# Patient Record
Sex: Male | Born: 1985 | Race: White | Hispanic: No | Marital: Married | Smoking: Never smoker
Health system: Southern US, Community
[De-identification: ages and names within clinical notes are randomized; demographics above are authoritative.]

## PROBLEM LIST (undated history)

## (undated) HISTORY — PX: VASECTOMY: SHX75

---

## 2019-03-17 ENCOUNTER — Emergency Department (HOSPITAL_BASED_OUTPATIENT_CLINIC_OR_DEPARTMENT_OTHER)
Admission: EM | Admit: 2019-03-17 | Discharge: 2019-03-17 | Disposition: A | Discharge status: Home / Self Care | Payer: 59 | Attending: Emergency Medicine | Admitting: Emergency Medicine

## 2019-03-17 ENCOUNTER — Emergency Department (HOSPITAL_BASED_OUTPATIENT_CLINIC_OR_DEPARTMENT_OTHER): Payer: 59

## 2019-03-17 ENCOUNTER — Encounter (HOSPITAL_BASED_OUTPATIENT_CLINIC_OR_DEPARTMENT_OTHER): Payer: Self-pay | Admitting: *Deleted

## 2019-03-17 ENCOUNTER — Other Ambulatory Visit: Payer: Self-pay

## 2019-03-17 DIAGNOSIS — R51 Headache: Secondary | ICD-10-CM | POA: Diagnosis not present

## 2019-03-17 DIAGNOSIS — F0781 Postconcussional syndrome: Secondary | ICD-10-CM | POA: Insufficient documentation

## 2019-03-17 DIAGNOSIS — R42 Dizziness and giddiness: Secondary | ICD-10-CM | POA: Diagnosis not present

## 2019-03-17 MED ORDER — DROPERIDOL 2.5 MG/ML IJ SOLN
2.5000 mg | Freq: Once | INTRAMUSCULAR | Status: AC
  Administered 2019-03-17: 16:00:00 2.5 mg via INTRAMUSCULAR
  Filled 2019-03-17: qty 2

## 2019-03-17 MED ORDER — MECLIZINE HCL 25 MG PO TABS
25.0000 mg | ORAL_TABLET | Freq: Once | ORAL | Status: AC
  Administered 2019-03-17: 25 mg via ORAL
  Filled 2019-03-17: qty 1
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
## 2019-03-17 NOTE — ED Notes (Signed)
Patient transported to CT 
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
## 2020-07-14 ENCOUNTER — Emergency Department (HOSPITAL_COMMUNITY): Payer: BC Managed Care – PPO

## 2020-07-14 ENCOUNTER — Encounter (HOSPITAL_COMMUNITY): Payer: Self-pay

## 2020-07-14 ENCOUNTER — Other Ambulatory Visit: Payer: Self-pay

## 2020-07-14 DIAGNOSIS — U071 COVID-19: Secondary | ICD-10-CM | POA: Insufficient documentation

## 2020-07-14 DIAGNOSIS — R05 Cough: Secondary | ICD-10-CM | POA: Diagnosis present

## 2020-07-14 DIAGNOSIS — Z5321 Procedure and treatment not carried out due to patient leaving prior to being seen by health care provider: Secondary | ICD-10-CM | POA: Insufficient documentation

## 2020-07-14 LAB — COMPREHENSIVE METABOLIC PANEL
ALT: 39 U/L (ref 0–44)
AST: 78 U/L — ABNORMAL HIGH (ref 15–41)
Albumin: 4.4 g/dL (ref 3.5–5.0)
Alkaline Phosphatase: 59 U/L (ref 38–126)
Anion gap: 9 (ref 5–15)
BUN: 12 mg/dL (ref 6–20)
CO2: 26 mmol/L (ref 22–32)
Calcium: 8.7 mg/dL — ABNORMAL LOW (ref 8.9–10.3)
Chloride: 102 mmol/L (ref 98–111)
Creatinine, Ser: 0.81 mg/dL (ref 0.61–1.24)
GFR calc Af Amer: >60 mL/min (ref >60)
GFR calc non Af Amer: >60 mL/min (ref >60)
Glucose, Bld: 120 mg/dL — ABNORMAL HIGH (ref 70–99)
Potassium: 4.3 mmol/L (ref 3.5–5.1)
Sodium: 137 mmol/L (ref 135–145)
Total Bilirubin: 0.3 mg/dL (ref 0.3–1.2)
Total Protein: 7.9 g/dL (ref 6.5–8.1)

## 2020-07-14 LAB — CBC
HCT: 43.1 % (ref 39.0–52.0)
Hemoglobin: 13.1 g/dL (ref 13.0–17.0)
MCH: 22 pg — ABNORMAL LOW (ref 26.0–34.0)
MCHC: 30.4 g/dL (ref 30.0–36.0)
MCV: 72.3 fL — ABNORMAL LOW (ref 80.0–100.0)
Platelets: 358 10*3/uL (ref 150–400)
RBC: 5.96 MIL/uL — ABNORMAL HIGH (ref 4.22–5.81)
RDW: 14.4 % (ref 11.5–15.5)
WBC: 6 10*3/uL (ref 4.0–10.5)
nRBC: 0 % (ref 0.0–0.2)

## 2020-07-14 LAB — LIPASE, BLOOD: Lipase: 25 U/L (ref 11–51)

## 2020-07-14 LAB — SARS CORONAVIRUS 2 BY RT PCR (HOSPITAL ORDER, PERFORMED IN ~~LOC~~ HOSPITAL LAB): SARS Coronavirus 2: POSITIVE — AB

## 2020-07-14 NOTE — ED Notes (Signed)
Date and time results received: 07/14/20 2330 (use smartphrase ".now" to insert current time)  Test: Covid Critical Value: Posistive  Name of Provider Notified: Silverio Lay Md Orders Received? Or Actions Taken?: No new orders

## 2020-07-14 NOTE — ED Triage Notes (Signed)
Pt reports N/V/SOB and cough since Friday. Reports that his cousins who visited him last week just tested positive for COVID. Pt is not vaccinated.

## 2020-07-15 ENCOUNTER — Emergency Department (HOSPITAL_COMMUNITY)
Admission: EM | Admit: 2020-07-15 | Discharge: 2020-07-15 | Disposition: A | Discharge status: Home / Self Care | Payer: BC Managed Care – PPO | Attending: Emergency Medicine | Admitting: Emergency Medicine

## 2020-07-15 NOTE — ED Notes (Signed)
Pt returned his stickers to registration  
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
## 2021-11-28 ENCOUNTER — Ambulatory Visit
Admission: EM | Admit: 2021-11-28 | Discharge: 2021-11-28 | Disposition: A | Discharge status: Home / Self Care | Payer: 59 | Attending: Emergency Medicine | Admitting: Emergency Medicine

## 2021-11-28 ENCOUNTER — Other Ambulatory Visit: Payer: Self-pay

## 2021-11-28 DIAGNOSIS — B9689 Other specified bacterial agents as the cause of diseases classified elsewhere: Secondary | ICD-10-CM | POA: Insufficient documentation

## 2021-11-28 DIAGNOSIS — J038 Acute tonsillitis due to other specified organisms: Secondary | ICD-10-CM | POA: Insufficient documentation

## 2021-11-28 DIAGNOSIS — J029 Acute pharyngitis, unspecified: Secondary | ICD-10-CM | POA: Insufficient documentation

## 2021-11-28 DIAGNOSIS — J358 Other chronic diseases of tonsils and adenoids: Secondary | ICD-10-CM

## 2021-11-28 DIAGNOSIS — J351 Hypertrophy of tonsils: Secondary | ICD-10-CM | POA: Insufficient documentation

## 2021-11-28 LAB — POCT RAPID STREP A (OFFICE): Rapid Strep A Screen: NEGATIVE

## 2021-11-28 MED ORDER — CEFDINIR 300 MG PO CAPS: 300.0000 mg | ORAL_CAPSULE | Freq: Two times a day (BID) | ORAL | 0 refills | Status: AC

## 2021-11-28 NOTE — Discharge Instructions (Addendum)
Based on my physical exam today and the history that you provided, I believe that you have bacterial pharyngitis.  Bacterial pharyngitis is most commonly caused by bacteria called group A Streptococcus but there are many other culprits.    The rapid strep test that we performed in the office only catches 40% of known cases of group A streptococcal pharyngitis.  Additionally, and unfortunately, the throat culture that we perform here in the urgent care only evaluates for group A strep and not any of the other bacteria  that are known to cause bacterial pharyngitis.  I have prescribed you an antibiotic to treat you for bacterial pharyngitis which covers group A strep along with other known causative organisms.  Please take this antibiotic as prescribed and do not skip any doses.  Once you have been on antibiotics for a full 24 hours, you are no longer considered contagious.  If you receive a phone call telling you that your throat culture is negative, please keep in mind that it is only negative for group A strep and that the lab does not test for any of the other bacteria that cause bacterial pharyngitis.  For this reason, I strongly recommend that you complete your entire prescription of antibiotics regardless of the result of a throat culture, particularly if you begin to feel better within the first 48 hours of therapy.  Please follow-up with your primary care provider in the next week to ten days for repeat evaluation.  Please follow-up within the next three to five days either with your primary care provider or urgent care if your symptoms do not resolve.  If you do not have a primary care provider, we will assist you in finding one.

## 2021-11-28 NOTE — ED Provider Notes (Signed)
[facility]    CSN: [id number] Arrival date & time: 11/28/21  [id number]    HISTORY   Chief Complaint  Patient presents with   Sore Throat   HPI Jordan Kidd is a 36 y.o. male. Patient complains of sore throat and fever.  Patient states has not tried any over-the-counter medications for his symptoms.  Patient denies known sick contacts.  Patient has a slightly elevated temperature 99.6 today with a normal heart rate.  Oxygenation is normal, patient has elevated blood pressure.  Patient states he has been waking up soaking wet middle of the night, states this has been going on for 3 nights.  Patient states he has 2 small children, no one at home is been sick.  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  Patient denies nasal congestion, cough, body aches, otalgia, nausea, vomiting, diarrhea, headache.  The history is provided by the patient.  History reviewed. No pertinent past medical history. There are no problems to display for this patient.  History reviewed. No pertinent surgical history.  Home Medications    Prior to Admission medications   Not on File   Family History History reviewed. No pertinent family history. Social History Social History   Tobacco Use   Smoking status: Never   Smokeless tobacco: Never  Substance Use Topics   Alcohol use: Not Currently   Drug use: Never   Allergies   Sumatriptan  Review of Systems Review of Systems Pertinent findings noted in history of present illness.   Physical Exam Triage Vital Signs ED Triage Vitals  Enc Vitals Group     BP 09/23/21 0827 (!) 147/82     Pulse Rate 09/23/21 0827 72     Resp 09/23/21 0827 18     Temp 09/23/21 0827 98.3 F (36.8 C)     Temp Source 09/23/21 0827 Oral     SpO2 09/23/21 0827 98 %     Weight --      Height --      Head Circumference --      Peak Flow --      Pain Score 09/23/21 0826 5     Pain Loc --      Pain Edu? --      Excl. in GC? --   No  data found.  Updated Vital Signs BP (!) 147/82 (BP Location: Left Arm)    Pulse 89    Temp 99.6 F (37.6 C) (Oral)    Resp 16    SpO2 100%   Physical Exam Constitutional:      General: He is not in acute distress.    Appearance: He is well-developed. He is not ill-appearing or toxic-appearing.  HENT:     Head: Normocephalic and atraumatic.     Salivary Glands: Right salivary gland is diffusely enlarged and tender. Left salivary gland is diffusely enlarged and tender.     Right Ear: Hearing and external ear normal.     Left Ear: Hearing and external ear normal.     Ears:     Comments: Bilateral EACs with mild erythema, bilateral TMs are normal    Nose: No mucosal edema, congestion or rhinorrhea.     Right Turbinates: Not enlarged, swollen or pale.     Left Turbinates: Not enlarged or swollen.     Right Sinus: No maxillary sinus tenderness or frontal sinus tenderness.     Left Sinus: No maxillary sinus tenderness or frontal sinus tenderness.  Mouth/Throat:     Lips: Pink. No lesions.     Mouth: Mucous membranes are moist. No oral lesions or angioedema.     Dentition: No gingival swelling.     Tongue: No lesions.     Palate: No mass.     Pharynx: Uvula midline. Pharyngeal swelling, oropharyngeal exudate and posterior oropharyngeal erythema present. No uvula swelling.     Tonsils: Tonsillar exudate present. 2+ on the right. 2+ on the left.  Eyes:     Extraocular Movements: Extraocular movements intact.     Conjunctiva/sclera: Conjunctivae normal.     Pupils: Pupils are equal, round, and reactive to light.  Neck:     Thyroid: No thyroid mass, thyromegaly or thyroid tenderness.     Trachea: Tracheal tenderness present. No abnormal tracheal secretions or tracheal deviation.     Comments: Voice is muffled Cardiovascular:     Rate and Rhythm: Normal rate and regular rhythm.     Pulses: Normal pulses.     Heart sounds: Normal heart sounds, S1 normal and S2 normal. No murmur heard.    No friction rub. No gallop.  Pulmonary:     Effort: Pulmonary effort is normal. No accessory muscle usage, prolonged expiration, respiratory distress or retractions.     Breath sounds: No stridor, decreased air movement or transmitted upper airway sounds. No decreased breath sounds, wheezing, rhonchi or rales.  Abdominal:     General: Bowel sounds are normal.     Palpations: Abdomen is soft.     Tenderness: There is generalized abdominal tenderness. There is no right CVA tenderness, left CVA tenderness or rebound. Negative signs include Murphy's sign.     Hernia: No hernia is present.  Musculoskeletal:        General: No tenderness. Normal range of motion.     Cervical back: Full passive range of motion without pain, normal range of motion and neck supple.     Right lower leg: No edema.     Left lower leg: No edema.  Lymphadenopathy:     Cervical: Cervical adenopathy present.     Right cervical: Superficial cervical adenopathy present.     Left cervical: Superficial cervical adenopathy present.  Skin:    General: Skin is warm and dry.     Findings: No erythema, lesion or rash.  Neurological:     General: No focal deficit present.     Mental Status: He is alert and oriented to person, place, and time. Mental status is at baseline.  Psychiatric:        Mood and Affect: Mood normal.        Behavior: Behavior normal.        Thought Content: Thought content normal.        Judgment: Judgment normal.    Visual Acuity Right Eye Distance:   Left Eye Distance:   Bilateral Distance:    Right Eye Near:   Left Eye Near:    Bilateral Near:     UC Couse / Diagnostics / Procedures:    EKG  Radiology No results found.  Procedures Procedures (including critical care time)  UC Diagnoses / Final Clinical Impressions(s)   I have reviewed the triage vital signs and the nursing notes.  Pertinent labs & imaging results that were available during my care of the patient were reviewed by  me and considered in my medical decision making (see chart for details).   Final diagnoses:  Enlarged tonsils  Tonsillar exudate  Acute pharyngitis, unspecified etiology  Acute bacterial tonsillitis   Bacterial tonsillitis versus pharyngitis possibly both.  Rapid strep test today is negative.  We will begin cefdinir to treat empirically for presumed bacterial infection.  Return precautions advised.  Note provided for work.  ED Prescriptions     Medication Sig Dispense Auth. Provider   cefdinir (OMNICEF) 300 MG capsule Take 1 capsule (300 mg total) by mouth 2 (two) times daily for 10 days. 20 capsule Theadora RamaMorgan, Merriam Brandner Scales, PA-C      PDMP not reviewed this encounter.  Pending results:  Labs Reviewed  CULTURE, GROUP A STREP [facility])  POCT RAPID STREP A (OFFICE)    Medications Ordered in UC: Medications - No data to display  Disposition Upon Discharge:  Condition: stable for discharge home Home: take medications as prescribed; routine discharge instructions as discussed; follow up as advised.  Patient presented with an acute illness with associated systemic symptoms and significant discomfort requiring urgent management. In my opinion, this is a condition that a prudent lay person (someone who possesses an average knowledge of health and medicine) may potentially expect to result in complications if not addressed urgently such as respiratory distress, impairment of bodily function or dysfunction of bodily organs.   Routine symptom specific, illness specific and/or disease specific instructions were discussed with the patient and/or caregiver at length.   As such, the patient has been evaluated and assessed, work-up was performed and treatment was provided in alignment with urgent care protocols and evidence based medicine.  Patient/parent/caregiver has been advised that the patient may require follow up for further testing and treatment if the symptoms continue in spite of treatment, as  clinically indicated and appropriate.  If the patient was tested for COVID-19, Influenza and/or RSV, then the patient/parent/guardian was advised to isolate at home pending the results of his/her diagnostic coronavirus test and potentially longer if theyre positive. I have also advised pt that if his/her COVID-19 test returns positive, it's recommended to self-isolate for at least 10 days after symptoms first appeared AND until fever-free for 24 hours without fever reducer AND other symptoms have improved or resolved. Discussed self-isolation recommendations as well as instructions for household member/close contacts as per the [facility] and [place] [facility], and also gave patient the COVID packet with this information.  Patient/parent/caregiver has been advised to return to the [facility] or PCP in 3-5 days if no better; to PCP or the Emergency Department if new signs and symptoms develop, or if the current signs or symptoms continue to change or worsen for further workup, evaluation and treatment as clinically indicated and appropriate  The patient will follow up with their current PCP if and as advised. If the patient does not currently have a PCP we will assist them in obtaining one.   The patient may need specialty follow up if the symptoms continue, in spite of conservative treatment and management, for further workup, evaluation, consultation and treatment as clinically indicated and appropriate.  Patient/parent/caregiver verbalized understanding and agreement of plan as discussed.  All questions were addressed during visit.  Please see discharge instructions below for further details of plan.  Discharge Instructions:   Discharge Instructions      Based on my physical exam today and the history that you provided, I believe that you have bacterial pharyngitis.  Bacterial pharyngitis is most commonly caused by bacteria called group A Streptococcus but there are many other culprits.    The rapid strep test that  we performed in the office only catches 40% of  known cases of group A streptococcal pharyngitis.  Additionally, and unfortunately, the throat culture that we perform here in the urgent care only evaluates for group A strep and not any of the other bacteria  that are known to cause bacterial pharyngitis.  I have prescribed you an antibiotic to treat you for bacterial pharyngitis which covers group A strep along with other known causative organisms.  Please take this antibiotic as prescribed and do not skip any doses.  Once you have been on antibiotics for a full 24 hours, you are no longer considered contagious.  If you receive a phone call telling you that your throat culture is negative, please keep in mind that it is only negative for group A strep and that the lab does not test for any of the other bacteria that cause bacterial pharyngitis.  For this reason, I strongly recommend that you complete your entire prescription of antibiotics regardless of the result of a throat culture, particularly if you begin to feel better within the first 48 hours of therapy.  Please follow-up with your primary care provider in the next week to ten days for repeat evaluation.  Please follow-up within the next three to five days either with your primary care provider or urgent care if your symptoms do not resolve.  If you do not have a primary care provider, we will assist you in finding one.       This office note has been dictated using Teaching laboratory technician.  Unfortunately, and despite my best efforts, this method of dictation can sometimes lead to occasional typographical or grammatical errors.  I apologize in advance if this occurs.     Theadora Rama Scales, PA-C 11/28/21 1051

## 2021-11-28 NOTE — ED Triage Notes (Signed)
Pt presents for sore throat and fever x 2-03 days. He is not taking anything for his symptoms.

## 2021-12-02 LAB — CULTURE, GROUP A STREP (THRC)

## 2022-01-24 ENCOUNTER — Encounter: Payer: Self-pay | Admitting: Emergency Medicine

## 2022-01-24 ENCOUNTER — Ambulatory Visit
Admission: EM | Admit: 2022-01-24 | Discharge: 2022-01-24 | Disposition: A | Discharge status: Home / Self Care | Payer: PRIVATE HEALTH INSURANCE | Attending: Emergency Medicine | Admitting: Emergency Medicine

## 2022-01-24 ENCOUNTER — Other Ambulatory Visit: Payer: Self-pay

## 2022-01-24 DIAGNOSIS — J3089 Other allergic rhinitis: Secondary | ICD-10-CM | POA: Insufficient documentation

## 2022-01-24 DIAGNOSIS — R52 Pain, unspecified: Secondary | ICD-10-CM | POA: Diagnosis not present

## 2022-01-24 DIAGNOSIS — R61 Generalized hyperhidrosis: Secondary | ICD-10-CM | POA: Insufficient documentation

## 2022-01-24 DIAGNOSIS — R6883 Chills (without fever): Secondary | ICD-10-CM | POA: Diagnosis not present

## 2022-01-24 DIAGNOSIS — J302 Other seasonal allergic rhinitis: Secondary | ICD-10-CM | POA: Insufficient documentation

## 2022-01-24 DIAGNOSIS — K219 Gastro-esophageal reflux disease without esophagitis: Secondary | ICD-10-CM | POA: Diagnosis present

## 2022-01-24 DIAGNOSIS — J039 Acute tonsillitis, unspecified: Secondary | ICD-10-CM | POA: Diagnosis not present

## 2022-01-24 LAB — POCT MONO SCREEN (KUC): Mono, POC: NEGATIVE

## 2022-01-24 LAB — POCT RAPID STREP A (OFFICE): Rapid Strep A Screen: NEGATIVE

## 2022-01-24 MED ORDER — CETIRIZINE HCL 10 MG PO TABS: 10.0000 mg | ORAL_TABLET | Freq: Every day | ORAL | 2 refills | Status: DC

## 2022-01-24 MED ORDER — AMOXICILLIN-POT CLAVULANATE 875-125 MG PO TABS: 1.0000 | ORAL_TABLET | Freq: Two times a day (BID) | ORAL | 0 refills | Status: AC

## 2022-01-24 MED ORDER — DOXYCYCLINE HYCLATE 100 MG PO CAPS: 100.0000 mg | ORAL_CAPSULE | Freq: Two times a day (BID) | ORAL | 0 refills | Status: DC

## 2022-01-24 MED ORDER — FLUTICASONE PROPIONATE 50 MCG/ACT NA SUSP: 2.0000 | Freq: Every day | NASAL | 0 refills | Status: DC

## 2022-01-24 MED ORDER — LANSOPRAZOLE 30 MG PO CPDR
30.0000 mg | DELAYED_RELEASE_CAPSULE | Freq: Every day | ORAL | 1 refills | Status: DC
Start: 2022-01-24 — End: 2023-09-04

## 2022-01-24 NOTE — ED Triage Notes (Signed)
Pt c/o sore throat with mucous and noticed white spots on back of throat. Has some post nasal drip and nasal congestion.

## 2022-01-24 NOTE — ED Provider Notes (Signed)
[facility]    CSN: [id number] Arrival date & time: 01/24/22  0801    HISTORY   Chief Complaint  Patient presents with   Sore Throat   HPI Jordan Kidd is a 36 y.o. male. Pt c/o sore throat with mucous and noticed white spots on back of throat.  Also has some post nasal drip and nasal congestion which has been persistent, patient states he does think he has allergies but does not currently take any allergy medication.  Patient was seen a month ago for similar symptoms, was treated with cefdinir empirically for bacterial tonsillitis, rapid strep test was negative, throat culture was negative for strep as well.  Patient said he did have relief of all of his symptoms for about a week and a half.  Patient states he and his wife have been passing "something" back-and-forth, they have both had intermittent fevers, night sweats, body aches and chills.  Patient states that when one of them starts to feel better the other started to feel sick again, patient adds that he has 2 children at home who have both been well throughout these events.  Patient reports a history of GERD, has actually been to the emergency room for this after eating a heavy meal with jalapenos.  Patient states he was given a thick white liquid to drink which completely resolved his symptoms.  Patient states he currently does not take anything for heartburn.  Patient states that he likes to eat rice every day, often suffers from constipation.  Patient states his current symptoms are usually worse at night, states he usually drinks water when he wakes up at night feeling like food is washing up and burning.  Patient denies history of tonsil stones.  Patient states he is unaware whether or not his tonsils ever returned to a more normal size after his first round of antibiotics.  The history is provided by the patient.  History reviewed. No pertinent past medical history. There are no problems to display for this  patient.  History reviewed. No pertinent surgical history.  Home Medications    Prior to Admission medications   Not on File   Family History No family history on file. Social History Social History   Tobacco Use   Smoking status: Never   Smokeless tobacco: Never  Substance Use Topics   Alcohol use: Not Currently   Drug use: Never   Allergies   Sumatriptan  Review of Systems Review of Systems Pertinent findings noted in history of present illness.   Physical Exam Triage Vital Signs ED Triage Vitals  Enc Vitals Group     BP 09/23/21 0827 (!) 147/82     Pulse Rate 09/23/21 0827 72     Resp 09/23/21 0827 18     Temp 09/23/21 0827 98.3 F (36.8 C)     Temp Source 09/23/21 0827 Oral     SpO2 09/23/21 0827 98 %     Weight --      Height --      Head Circumference --      Peak Flow --      Pain Score 09/23/21 0826 5     Pain Loc --      Pain Edu? --      Excl. in GC? --   No data found.  Updated Vital Signs BP 124/85 (BP Location: Right Arm)    Pulse 93    Temp 99 F (37.2 C) (Oral)    Resp 15  SpO2 98%   Physical Exam Constitutional:      General: He is not in acute distress.    Appearance: He is well-developed. He is ill-appearing. He is not toxic-appearing.  HENT:     Head: Normocephalic and atraumatic.     Salivary Glands: Right salivary gland is diffusely enlarged and tender. Left salivary gland is diffusely enlarged and tender.     Right Ear: Hearing and external ear normal.     Left Ear: Hearing and external ear normal.     Ears:     Comments: Bilateral EACs with mild erythema, bilateral TMs are normal    Nose: No mucosal edema, congestion or rhinorrhea.     Right Turbinates: Not enlarged, swollen or pale.     Left Turbinates: Not enlarged or swollen.     Right Sinus: No maxillary sinus tenderness or frontal sinus tenderness.     Left Sinus: No maxillary sinus tenderness or frontal sinus tenderness.     Mouth/Throat:     Lips: Pink. No lesions.      Mouth: Mucous membranes are moist. No oral lesions or angioedema.     Dentition: No gingival swelling.     Tongue: No lesions.     Palate: No mass.     Pharynx: Uvula midline. Pharyngeal swelling, oropharyngeal exudate and posterior oropharyngeal erythema present. No uvula swelling.     Tonsils: Tonsillar exudate present. 2+ on the right. 2+ on the left.  Eyes:     Extraocular Movements: Extraocular movements intact.     Conjunctiva/sclera: Conjunctivae normal.     Pupils: Pupils are equal, round, and reactive to light.  Neck:     Thyroid: No thyroid mass, thyromegaly or thyroid tenderness.     Trachea: Tracheal tenderness present. No abnormal tracheal secretions or tracheal deviation.     Comments: Voice is muffled Cardiovascular:     Rate and Rhythm: Normal rate and regular rhythm.     Pulses: Normal pulses.     Heart sounds: Normal heart sounds, S1 normal and S2 normal. No murmur heard.   No friction rub. No gallop.  Pulmonary:     Effort: Pulmonary effort is normal. No accessory muscle usage, prolonged expiration, respiratory distress or retractions.     Breath sounds: No stridor, decreased air movement or transmitted upper airway sounds. No decreased breath sounds, wheezing, rhonchi or rales.  Abdominal:     General: Bowel sounds are normal.     Palpations: Abdomen is soft.     Tenderness: There is generalized abdominal tenderness. There is no right CVA tenderness, left CVA tenderness or rebound. Negative signs include Murphy's sign.     Hernia: No hernia is present.  Musculoskeletal:        General: No tenderness. Normal range of motion.     Cervical back: Full passive range of motion without pain, normal range of motion and neck supple.     Right lower leg: No edema.     Left lower leg: No edema.  Lymphadenopathy:     Cervical: Cervical adenopathy present.     Right cervical: Superficial cervical adenopathy present.     Left cervical: Superficial cervical adenopathy  present.  Skin:    General: Skin is warm and dry.     Findings: No erythema, lesion or rash.  Neurological:     General: No focal deficit present.     Mental Status: He is alert and oriented to person, place, and time. Mental status is at baseline.  Psychiatric:        Mood and Affect: Mood normal.        Behavior: Behavior normal.        Thought Content: Thought content normal.        Judgment: Judgment normal.    Visual Acuity Right Eye Distance:   Left Eye Distance:   Bilateral Distance:    Right Eye Near:   Left Eye Near:    Bilateral Near:     UC Couse / Diagnostics / Procedures:    EKG  Radiology No results found.  Procedures Procedures (including critical care time)  UC Diagnoses / Final Clinical Impressions(s)   I have reviewed the triage vital signs and the nursing notes.  Pertinent labs & imaging results that were available during my care of the patient were reviewed by me and considered in my medical decision making (see chart for details).   Final diagnoses:  Acute tonsillitis, unspecified etiology  Night sweats  Generalized body aches  Chills  Perennial allergic rhinitis with seasonal variation  Gastroesophageal reflux disease, unspecified whether esophagitis present   Rapid test today is negative, Monospot test today is negative.  Throat culture pending.  We will repeat antibiotics with broader spectrum coverage.  Patient also provided with a prescription for lansoprazole and advised to take stool softener every day in an effort to rule out GERD possibly causing his symptoms.  ED Prescriptions     Medication Sig Dispense Auth. Provider   lansoprazole (PREVACID) 30 MG capsule Take 1 capsule (30 mg total) by mouth daily at 12 noon. 90 capsule Theadora RamaMorgan, Annah Jasko Scales, PA-C   cetirizine (ZYRTEC ALLERGY) 10 MG tablet Take 1 tablet (10 mg total) by mouth at bedtime. 30 tablet Theadora RamaMorgan, Delmar Arriaga Scales, PA-C   fluticasone (FLONASE) 50 MCG/ACT nasal spray Place 2  sprays into both nostrils daily. 18 mL Theadora RamaMorgan, Viriginia Amendola Scales, PA-C   amoxicillin-clavulanate (AUGMENTIN) 875-125 MG tablet Take 1 tablet by mouth every 12 (twelve) hours for 10 days. 20 tablet Theadora RamaMorgan, Kilani Joffe Scales, PA-C   doxycycline (VIBRAMYCIN) 100 MG capsule Take 1 capsule (100 mg total) by mouth 2 (two) times daily. 20 capsule Theadora RamaMorgan, Pama Roskos Scales, PA-C      PDMP not reviewed this encounter.  Pending results:  Labs Reviewed  CULTURE, GROUP A STREP ([facility])  COVID-19, FLU A+B NAA  POCT RAPID STREP A (OFFICE)  POCT MONO SCREEN ([facility])    Medications Ordered in UC: Medications - No data to display  Disposition Upon Discharge:  Condition: stable for discharge home Home: take medications as prescribed; routine discharge instructions as discussed; follow up as advised.  Patient presented with an acute illness with associated systemic symptoms and significant discomfort requiring urgent management. In my opinion, this is a condition that a prudent lay person (someone who possesses an average knowledge of health and medicine) may potentially expect to result in complications if not addressed urgently such as respiratory distress, impairment of bodily function or dysfunction of bodily organs.   Routine symptom specific, illness specific and/or disease specific instructions were discussed with the patient and/or caregiver at length.   As such, the patient has been evaluated and assessed, work-up was performed and treatment was provided in alignment with urgent care protocols and evidence based medicine.  Patient/parent/caregiver has been advised that the patient may require follow up for further testing and treatment if the symptoms continue in spite of treatment, as clinically indicated and appropriate.  If the patient was tested for COVID-19, Influenza and/or RSV,  then the patient/parent/guardian was advised to isolate at home pending the results of his/her diagnostic coronavirus test and  potentially longer if theyre positive. I have also advised pt that if his/her COVID-19 test returns positive, it's recommended to self-isolate for at least 10 days after symptoms first appeared AND until fever-free for 24 hours without fever reducer AND other symptoms have improved or resolved. Discussed self-isolation recommendations as well as instructions for household member/close contacts as per the [facility] and [place] DHHS, and also gave patient the COVID packet with this information.  Patient/parent/caregiver has been advised to return to the [facility] or PCP in 3-5 days if no better; to PCP or the Emergency Department if new signs and symptoms develop, or if the current signs or symptoms continue to change or worsen for further workup, evaluation and treatment as clinically indicated and appropriate  The patient will follow up with their current PCP if and as advised. If the patient does not currently have a PCP we will assist them in obtaining one.   The patient may need specialty follow up if the symptoms continue, in spite of conservative treatment and management, for further workup, evaluation, consultation and treatment as clinically indicated and appropriate.  Patient/parent/caregiver verbalized understanding and agreement of plan as discussed.  All questions were addressed during visit.  Please see discharge instructions below for further details of plan.  Discharge Instructions:   Discharge Instructions      Your rapid strep test today was again negative, we will perform the throat culture as before.  As we discussed, it is possible that some of your tonsil inflammation could be related to uncontrolled reflux.  Please begin Prevacid 1 capsule daily at the same time every day, does not matter whether you take it in the morning midday or evening or with or without food just as long as its at the same time.  This medication should reduce your acid production, improve your nighttime symptoms of  heartburn and may allow your tonsils to calm down.  I also believe that you would benefit from using some allergy medications for the next few months, I have prescribed Zyrtec which is a tablet to take at bedtime and Flonase which is a nasal steroid that I like you to use every day in the morning.  Place 2 sprays in each nostril daily for the first week or so, that you can decrease to 1 spray in each nostril daily.  To treat you empirically for bacterial tonsillitis, I recommend that you take 2 antibiotics for the next 10 days.  Augmentin and doxycycline together will cover pretty much any bacteria that commonly infect tonsils that I know of.  You may find that you have a few episodes of diarrhea while taking this medication, please do not be concerned because this will resolve when she finished antibiotics.  Finally, to be thorough and complete, I recommend that you begin taking a fiber supplement either once or twice daily to help improve your bowel movements.  Fiber pulls water into the stool making it bulkier, this makes it easier to pass.   If none of these treatments are successful in resolving your issues of intermittent fever, enlarged tonsils, itchy/sore throat and night sweats, I strongly recommend that you follow-up with your primary care provider for possible referral to ear nose and throat or gastroenterology.  Thank you again for visiting urgent care today.  We appreciate the opportunity to participate in your care.    This office note has  been dictated using Teaching laboratory technicianDragon speech recognition software.  Unfortunately, and despite my best efforts, this method of dictation can sometimes lead to occasional typographical or grammatical errors.  I apologize in advance if this occurs.     Theadora RamaMorgan, Taleisha Kaczynski Scales, PA-C 01/25/22 [phone]

## 2022-01-24 NOTE — Discharge Instructions (Addendum)
Your rapid strep test today was again negative, we will perform the throat culture as before.  As we discussed, it is possible that some of your tonsil inflammation could be related to uncontrolled reflux.  Please begin Prevacid 1 capsule daily at the same time every day, does not matter whether you take it in the morning midday or evening or with or without food just as long as its at the same time.  This medication should reduce your acid production, improve your nighttime symptoms of heartburn and may allow your tonsils to calm down.  I also believe that you would benefit from using some allergy medications for the next few months, I have prescribed Zyrtec which is a tablet to take at bedtime and Flonase which is a nasal steroid that I like you to use every day in the morning.  Place 2 sprays in each nostril daily for the first week or so, that you can decrease to 1 spray in each nostril daily.  To treat you empirically for bacterial tonsillitis, I recommend that you take 2 antibiotics for the next 10 days.  Augmentin and doxycycline together will cover pretty much any bacteria that commonly infect tonsils that I know of.  You may find that you have a few episodes of diarrhea while taking this medication, please do not be concerned because this will resolve when she finished antibiotics.  Finally, to be thorough and complete, I recommend that you begin taking a fiber supplement either once or twice daily to help improve your bowel movements.  Fiber pulls water into the stool making it bulkier, this makes it easier to pass.   If none of these treatments are successful in resolving your issues of intermittent fever, enlarged tonsils, itchy/sore throat and night sweats, I strongly recommend that you follow-up with your primary care provider for possible referral to ear nose and throat or gastroenterology.  Thank you again for visiting urgent care today.  We appreciate the opportunity to participate in  your care.

## 2022-01-26 LAB — COVID-19, FLU A+B NAA
Influenza A, NAA: NOT DETECTED
Influenza B, NAA: NOT DETECTED
SARS-CoV-2, NAA: NOT DETECTED

## 2022-01-27 LAB — CULTURE, GROUP A STREP (THRC)

## 2022-03-26 IMAGING — CR DG CHEST 2V
2 series · 2 of 2 positions shown · non-contrast
Comparison: March 17, 2019

CLINICAL DATA: Cough and fever.

EXAM:
CHEST - 2 VIEW

[w chest pa]
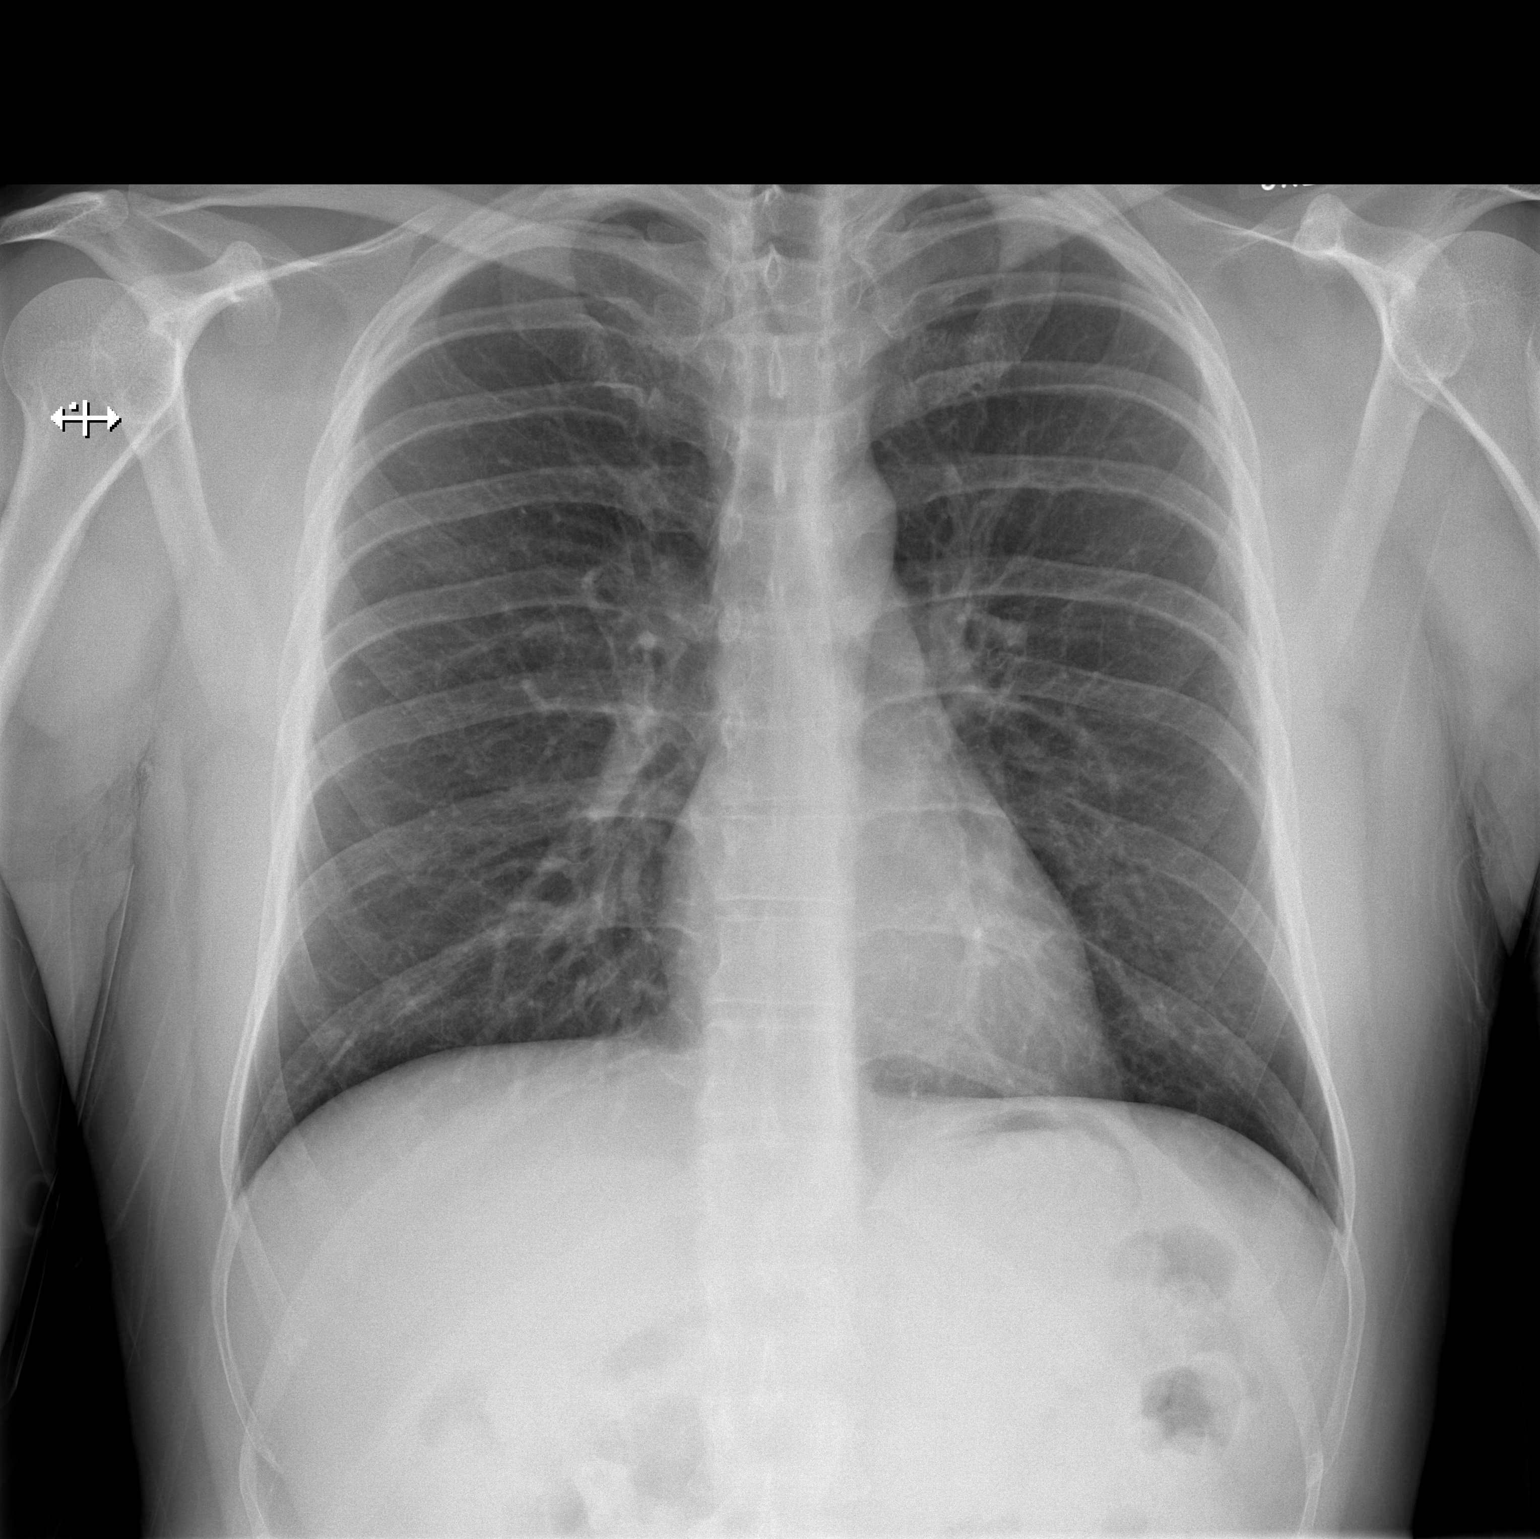

[w chest lat]
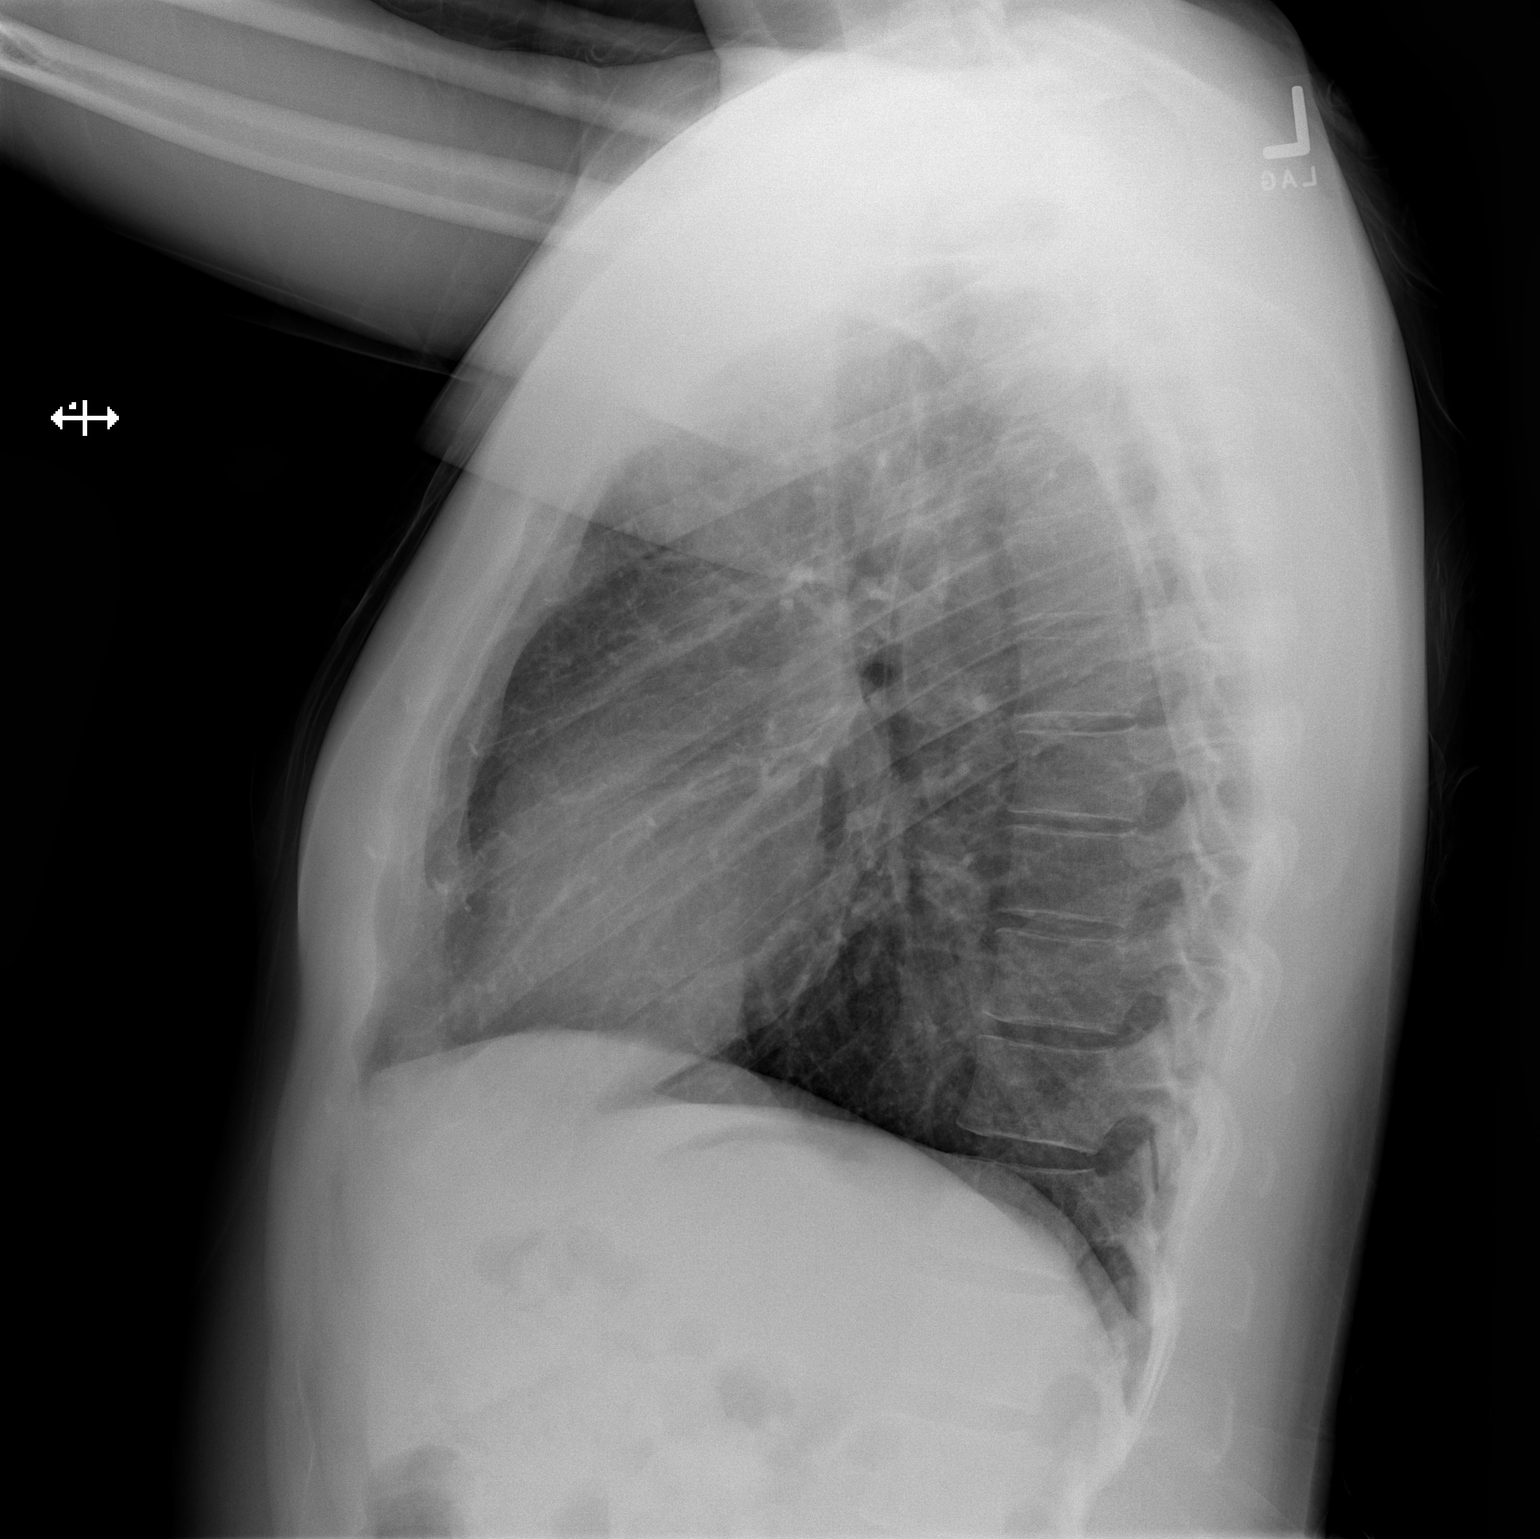

[2 of 2 positions shown; findings below may reference images not displayed]

FINDINGS: The heart size and mediastinal contours are within normal limits.
Both lungs are clear. The visualized skeletal structures are
unremarkable.
IMPRESSION: No active cardiopulmonary disease.

## 2023-08-17 ENCOUNTER — Ambulatory Visit: Payer: Self-pay

## 2023-08-17 ENCOUNTER — Other Ambulatory Visit: Payer: Self-pay | Admitting: Nurse Practitioner

## 2023-08-17 DIAGNOSIS — S29011A Strain of muscle and tendon of front wall of thorax, initial encounter: Secondary | ICD-10-CM

## 2023-09-04 ENCOUNTER — Ambulatory Visit
Admission: EM | Admit: 2023-09-04 | Discharge: 2023-09-04 | Disposition: A | Discharge status: Home / Self Care | Payer: Managed Care, Other (non HMO) | Attending: Internal Medicine | Admitting: Internal Medicine

## 2023-09-04 DIAGNOSIS — R070 Pain in throat: Secondary | ICD-10-CM | POA: Insufficient documentation

## 2023-09-04 DIAGNOSIS — J069 Acute upper respiratory infection, unspecified: Secondary | ICD-10-CM | POA: Diagnosis present

## 2023-09-04 LAB — POCT RAPID STREP A (OFFICE): Rapid Strep A Screen: NEGATIVE

## 2023-09-04 MED ORDER — PSEUDOEPHEDRINE HCL 60 MG PO TABS: 60.0000 mg | ORAL_TABLET | Freq: Three times a day (TID) | ORAL | 0 refills | Status: AC | PRN

## 2023-09-04 MED ORDER — CETIRIZINE HCL 10 MG PO TABS: 10.0000 mg | ORAL_TABLET | Freq: Every day | ORAL | 0 refills | Status: AC

## 2023-09-04 NOTE — ED Provider Notes (Signed)
[facility]  Note:  This document was prepared using Conservation officer, historic buildings and may include unintentional dictation errors.  MRN: [id number] DOB: Apr 24, 1986  Subjective:   Jordan Kidd is a 37 y.o. male presenting for 2 day history of stuffy nose, throat pain, scratchy throat. No fever, ear pain, cough, shob, wheezing. No smoking of any kind including cigarettes, cigars, vaping, marijuana use.  No history of respiratory disorders.   No current facility-administered medications for this encounter.  Current Outpatient Medications:    methocarbamol (ROBAXIN) 500 MG tablet, Take 500 mg by mouth every 8 (eight) hours as needed., Disp: , Rfl:    Allergies  Allergen Reactions   Sumatriptan Other (See Comments)    Neck stiffness/pain Burning sensation in nostrils    History reviewed. No pertinent past medical history.   History reviewed. No pertinent surgical history.  History reviewed. No pertinent family history.  Social History   Tobacco Use   Smoking status: Never   Smokeless tobacco: Never  Vaping Use   Vaping status: Never Used  Substance Use Topics   Alcohol use: Not Currently   Drug use: Never    ROS   Objective:   Vitals: BP 136/77 (BP Location: Right Arm)   Pulse 60   Temp 98.2 F (36.8 C) (Oral)   Resp 16   SpO2 98%   Physical Exam Constitutional:      General: He is not in acute distress.    Appearance: Normal appearance. He is well-developed and normal weight. He is not ill-appearing, toxic-appearing or diaphoretic.  HENT:     Head: Normocephalic and atraumatic.     Right Ear: External ear normal.     Left Ear: External ear normal.     Nose: Nose normal.     Mouth/Throat:     Pharynx: No pharyngeal swelling, oropharyngeal exudate, posterior oropharyngeal erythema or uvula swelling.     Tonsils: No tonsillar exudate or tonsillar abscesses. 0 on the right. 0 on the left.  Eyes:     General: No scleral  icterus.       Right eye: No discharge.        Left eye: No discharge.     Extraocular Movements: Extraocular movements intact.  Cardiovascular:     Rate and Rhythm: Normal rate.  Pulmonary:     Effort: Pulmonary effort is normal.  Musculoskeletal:     Cervical back: Normal range of motion.  Neurological:     Mental Status: He is alert and oriented to person, place, and time.  Psychiatric:        Mood and Affect: Mood normal.        Behavior: Behavior normal.        Thought Content: Thought content normal.        Judgment: Judgment normal.    Results for orders placed or performed during the hospital encounter of 09/04/23 (from the past 24 hour(s))  POCT rapid strep A     Status: None   Collection Time: 09/04/23  8:31 AM  Result Value Ref Range   Rapid Strep A Screen Negative Negative    Assessment and Plan :   PDMP not reviewed this encounter.  1. Acute upper respiratory infection   2. Throat pain    Strep culture pending.  Recommend supportive care for an acute viral pharyngitis, viral upper respiratory infection. Counseled patient on potential for adverse effects with medications prescribed/recommended today, ER and return-to-clinic precautions discussed, patient verbalized understanding.  Wallis Bamberg, [place] 09/04/23 [phone]

## 2023-09-04 NOTE — ED Triage Notes (Signed)
Pt reports sore throat x 2 days. States started itchy throat, lats night he saw some spots and [phlegm in the throat. Pt want a Strep test.

## 2023-09-04 NOTE — Discharge Instructions (Addendum)
The throat culture for a double check on strep will result in 2-3 days. We will manage this as a viral upper respiratory infection. For sore throat or cough try using a honey-based tea. Use 3 teaspoons of honey with juice squeezed from half lemon. Place shaved pieces of ginger into 1/2-1 cup of water and warm over stove top. Then mix the ingredients and repeat every 4 hours as needed. Please take Tylenol 650mg  once every 6 hours for fevers, aches and pains. Hydrate very well with at least 2 liters (80 ounces) of water. Eat light meals such as soups (chicken and noodles, chicken wild rice, vegetable).  Do not eat any foods that you are allergic to.  Start an antihistamine like Zyrtec (10mg  daily) for postnasal drainage, sinus congestion.  You can take this together with pseudoephedrine (Sudafed) at a dose of 60 mg 3 times a day or twice daily as needed for the same kind of congestion. Keep taking your Aleve.

## 2023-09-06 LAB — CULTURE, GROUP A STREP (THRC)

## 2023-09-07 ENCOUNTER — Telehealth: Payer: Self-pay

## 2023-09-07 MED ORDER — AMOXICILLIN 500 MG PO CAPS: 500.0000 mg | ORAL_CAPSULE | Freq: Two times a day (BID) | ORAL | 0 refills | Status: AC

## 2023-09-07 NOTE — Telephone Encounter (Signed)
Per Helaine Chess,  PA-C, treat with Amoxicillin 500mg  BID x10 if pt is still symptomatic.  Reviewed with patient, verified pharmacy, prescription sent.

## 2023-10-08 ENCOUNTER — Ambulatory Visit (INDEPENDENT_AMBULATORY_CARE_PROVIDER_SITE_OTHER): Payer: Worker's Compensation | Admitting: Sports Medicine

## 2023-10-08 ENCOUNTER — Encounter: Payer: Self-pay | Admitting: Sports Medicine

## 2023-10-08 DIAGNOSIS — R0789 Other chest pain: Secondary | ICD-10-CM

## 2023-10-08 MED ORDER — MELOXICAM 15 MG PO TABS: 15.0000 mg | ORAL_TABLET | Freq: Every day | ORAL | 0 refills | Status: AC

## 2023-10-08 NOTE — Progress Notes (Signed)
Patient says that he got hurt at work on 9/19. 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 He says that his pain is more soreness now and has narrowed. He says it does not hurt to touch but if he stretches back it feels sore and if he sits with his shoulders rounded forward for longer periods of time it will feel sore.

## 2023-10-08 NOTE — Progress Notes (Signed)
Jordan Kidd - 37 y.o. male MRN [id number]  Date of birth: 1986/04/10  Office Visit Note: Visit Date: 10/08/2023 PCP: Patient, No Pcp Per Referred by: No ref. provider found  Subjective: Chief Complaint  Patient presents with   Chest - Pain, Follow-up   HPI: Jordan Kidd is a pleasant 37 y.o. male who presents today for anterior chest wall and sternal pain after work injury.   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  He was able to continue working but his pain still persisted so later that day he called his supervisor and notified him.  They did send him for evaluation at [facility] outpatient health and wellness, he did have an x-ray which showed no acute fracture.  He saw a medical physician for this and they have been keeping him out of work given his pain.  His pain is better but still continues in the midline of the chest and lower sternum.  It is worse with certain pushing maneuvers as well as sitting with his shoulders rounded in a long position for periods of time.  Feels more of a soreness.  He denies any shortness of breath.  No prior injury to the chest or ribs.  He is taking any medication for his pain, he does not want any narcotic pain medication prescribed.  Note reviewed from Terie Purser, NP on 08/17/23 - 2view chest x-ray negative. Recommended rest and time off from work.  Pertinent ROS were reviewed with the patient and found to be negative unless otherwise specified above in HPI.   Assessment & Plan: Visit Diagnoses:  1. Sternal pain    Plan: Sierra had an incident at work where he felt a pop and a sharp pain over the sternum which has not gotten better with rest and activity modification.  He has been unable to get back to work given the demanding physical activity and continued pain.  Given that it has been almost 2 months, I would like to rule out any occult  fracture of the sternum/xiphoid as well as any disarticulation of the ribs as they enter into the central chest, this would need to be evaluated with a CT scan of the chest without contrast, this was ordered today.  We will start him on meloxicam 15 mg to be taken once daily for the next few weeks.  We will also get him into formalized physical therapy.  I did put him on a 10 pound lifting restriction of the upper extremities until his CT scan returns and he is seen in follow-up.  If this is unable to be accommodated, he will be considered out of work until this time.  He will follow-up with me about 3-4 business days after the CT scan of chest.  Follow-up: Return for f/u about 3-4 days after CT-scan chest.   Meds & Orders:  Meds ordered this encounter  Medications   meloxicam (MOBIC) 15 MG tablet    Sig: Take 1 tablet (15 mg total) by mouth daily.    Dispense:  30 tablet    Refill:  0    Orders Placed This Encounter  Procedures   CT CHEST WO CONTRAST     Procedures: No procedures performed      Clinical History: No specialty comments available.  He reports that he has never smoked. He has never used smokeless tobacco. No results  for input(s): "HGBA1C", "LABURIC" in the last 8760 hours.  Objective:    Physical Exam  Gen: Well-appearing, in no acute distress; non-toxic CV: Regular Rate. Well-perfused. Warm.  Resp: Breathing unlabored on room air; no wheezing. Psych: Fluid speech in conversation; appropriate affect; normal thought process Neuro: Sensation intact throughout. No gross coordination deficits.   Ortho Exam - Chest/Sternum: + TTP over the lower aspect of the sternum, specifically the xiphoid process insertional ribs.  Able to take full breaths which are symmetric in nature.  No coughing or wheezing.   - Shoulders: No AC joint or otherwise bony TTP.  There is full active and passive range of motion in shoulder joint/arms.  Imaging:  *Independent review and  interpretation of 2 views of the chest x-ray both AP and lateral was performed by myself today.  There is no thoracic vertebral bony abnormality.  No abnormality of the as Xu for the superior aspect of the sternum, the lower sternal and xiphoid process are incompletely evaluated given the aortic arch and overlying cardiopulmonary organs.  DG Chest 2 View CLINICAL DATA:  anterior chest pain  EXAM: CHEST - 2 VIEW  COMPARISON:  July 14, 2020  FINDINGS: The cardiomediastinal silhouette is normal in contour. No pleural effusion. No pneumothorax. No acute pleuroparenchymal abnormality. Visualized abdomen is unremarkable. No acute osseous abnormality noted.  IMPRESSION: No acute cardiopulmonary abnormality.  Electronically Signed   By: Meda Klinefelter M.D.   On: 08/17/2023 12:56  Past Medical/Family/Surgical/Social History: Medications & Allergies reviewed per EMR, new medications updated. There are no problems to display for this patient.  History reviewed. No pertinent past medical history. History reviewed. No pertinent family history. History reviewed. No pertinent surgical history. Social History   Occupational History   Not on file  Tobacco Use   Smoking status: Never   Smokeless tobacco: Never  Vaping Use   Vaping status: Never Used  Substance and Sexual Activity   Alcohol use: Not Currently   Drug use: Never   Sexual activity: Yes

## 2023-10-15 ENCOUNTER — Other Ambulatory Visit: Payer: Managed Care, Other (non HMO)

## 2023-10-23 ENCOUNTER — Encounter: Payer: Self-pay | Admitting: Sports Medicine

## 2023-10-23 ENCOUNTER — Ambulatory Visit
Admission: RE | Admit: 2023-10-23 | Discharge: 2023-10-23 | Disposition: A | Discharge status: Home / Self Care | Payer: Managed Care, Other (non HMO) | Source: Ambulatory Visit | Attending: Sports Medicine

## 2023-10-23 DIAGNOSIS — R0789 Other chest pain: Secondary | ICD-10-CM

## 2023-11-05 ENCOUNTER — Other Ambulatory Visit: Payer: Self-pay | Admitting: Sports Medicine

## 2023-11-05 DIAGNOSIS — R0789 Other chest pain: Secondary | ICD-10-CM

## 2023-11-14 NOTE — Telephone Encounter (Signed)
Needs CT and further eval before refill.

## 2023-12-07 ENCOUNTER — Ambulatory Visit
Admission: RE | Admit: 2023-12-07 | Discharge: 2023-12-07 | Disposition: A | Discharge status: Home / Self Care | Payer: Managed Care, Other (non HMO) | Source: Ambulatory Visit | Attending: Sports Medicine

## 2023-12-07 DIAGNOSIS — R0789 Other chest pain: Secondary | ICD-10-CM

## 2023-12-10 ENCOUNTER — Encounter: Payer: Self-pay | Admitting: Pediatrics

## 2023-12-10 ENCOUNTER — Encounter: Payer: Self-pay | Admitting: Nurse Practitioner

## 2023-12-13 ENCOUNTER — Telehealth: Payer: Self-pay | Admitting: Sports Medicine

## 2023-12-13 NOTE — Telephone Encounter (Signed)
Pt called asking if anyone way his Ct Scan can be sent back sooner and have a sooner. Pt phone number is [phone].

## 2023-12-17 NOTE — Telephone Encounter (Signed)
[facility] to have scan read.

## 2023-12-19 ENCOUNTER — Ambulatory Visit (INDEPENDENT_AMBULATORY_CARE_PROVIDER_SITE_OTHER): Payer: Managed Care, Other (non HMO) | Admitting: Sports Medicine

## 2023-12-19 ENCOUNTER — Encounter: Payer: Self-pay | Admitting: Sports Medicine

## 2023-12-19 DIAGNOSIS — R0789 Other chest pain: Secondary | ICD-10-CM | POA: Diagnosis not present

## 2023-12-19 NOTE — Progress Notes (Signed)
Patient says that he no longer has pain, but he does still have some soreness in the same spot. He says that he did try to do a home work out which aggravated it, but after a week or so he felt better again.

## 2023-12-19 NOTE — Progress Notes (Signed)
Jordan Kidd - 38 y.o. male MRN [id number]  Date of birth: 04-28-1986  Office Visit Note: Visit Date: 12/19/2023 PCP: Patient, No Pcp Per Referred by: No ref. provider found  Subjective: Chief Complaint  Patient presents with   Chest - Follow-up   HPI: Jordan Kidd is a pleasant 38 y.o. male who presents today for follow-up of chest/sternal injury.  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  He was able to continue working but his pain still persisted so later that day he called his supervisor and notified him.  They did send him for evaluation at [facility] outpatient health and wellness, he did have an x-ray which showed no acute fracture   Today, Devaughn states she is doing much better.  He really does not have any pain.  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  He did try to do some push outs at home workout which mildly aggravated his pain but then it completely resolved.  Pertinent ROS were reviewed with the patient and found to be negative unless otherwise specified above in HPI.   Assessment & Plan: Visit Diagnoses:  1. Sternal pain    Plan: Impression is resolving sternal pain which is likely the result of a sternal bruise/cartilage disarticulation from his work injury.  He is several months out from this and is no longer having any pain on a consistent basis.  I would like him to start getting back into physical activity, discussed starting this at a graded, slowly progressive phase.  CT scan ruled out any bony fracture or acute underlying lung pathology.  He is free to return to work without any physical restrictions.  I did discuss if he has some mild soreness or issues with returning to physical activity, he could benefit from a few sessions of guided physical therapy, but we will hold on this for now given his improvement.   Follow-up as needed.  Follow-up: Return if symptoms worsen or fail to improve.   Meds & Orders: No orders of the defined types were placed in this encounter.  No orders of the defined types were placed in this encounter.    Procedures: No procedures performed      Clinical History: No specialty comments available.  He reports that he has never smoked. He has never used smokeless tobacco. No results for input(s): "HGBA1C", "LABURIC" in the last 8760 hours.  Objective:    Physical Exam  Gen: Well-appearing, in no acute distress; non-toxic CV: Well-perfused. Warm.  Resp: Breathing unlabored on room air; no wheezing. Psych: Fluid speech in conversation; appropriate affect; normal thought process  Ortho Exam - Chest: No TTP over the sternum or xiphoid.  There is full breaths which are symmetric in nature with appropriate rib brace and normal work of breathing.  Scapular retraction/protraction without pain, pectoralis squeeze test negative.  Imaging:  CT CHEST WO CONTRAST CLINICAL DATA:  Fracture, sternum  Sternal pain, injury at work.  EXAM: CT CHEST WITHOUT CONTRAST  TECHNIQUE: Multidetector CT imaging of the chest was performed following the standard protocol without IV contrast.  RADIATION DOSE REDUCTION: This exam was performed according to the departmental dose-optimization program which includes automated exposure control, adjustment of the mA and/or kV according to patient size and/or use of iterative reconstruction technique.  COMPARISON:  Chest radiograph 08/17/2023, chest CT 03/13/2019  FINDINGS: Cardiovascular: The heart is normal in size. No pericardial effusion. The thoracic aorta is normal in caliber. Normal caliber of the main pulmonary artery.  Mediastinum/Nodes: Ill-defined soft tissue density in the anterior mediastinum, diminished from prior exam likely minor residual thymus. No discretely enlarged mediastinal lymph nodes. No axillary adenopathy.  Hilar assessment is limited in the absence of IV contrast. Decompressed esophagus.  Lungs/Pleura: Clear lungs. No focal airspace disease. No pleural effusion. Tiny pulmonary nodules, largest measuring 4 mm in the right lower lobe, pleural based, series 5, image 75. Nodules are stable from prior exam and need no further imaging follow-up. Trachea and central airways are clear.  Upper Abdomen: No acute or unexpected extracardiac findings.  Musculoskeletal: No acute or evidence of healing/healed sternal fracture. No acute or healing/healed rib fractures. Unremarkable appearance of the thoracic spine. Included clavicles and shoulder girdles are intact. No chest wall soft tissue abnormalities.  IMPRESSION: 1. No acute or healing/healed sternal fracture. No explanation for sternal pain. 2. Tiny pulmonary nodules are stable from 2020 exam, and considered definitively benign. No further imaging follow-up is needed.  Electronically Signed   By: Narda Rutherford M.D.   On: 12/18/2023 11:27  Past Medical/Family/Surgical/Social History: Medications & Allergies reviewed per EMR, new medications updated. There are no active problems to display for this patient.  History reviewed. No pertinent past medical history. History reviewed. No pertinent family history. History reviewed. No pertinent surgical history. Social History   Occupational History   Not on file  Tobacco Use   Smoking status: Never   Smokeless tobacco: Never  Vaping Use   Vaping status: Never Used  Substance and Sexual Activity   Alcohol use: Not Currently   Drug use: Never   Sexual activity: Yes

## 2023-12-25 ENCOUNTER — Ambulatory Visit: Payer: Managed Care, Other (non HMO) | Admitting: Sports Medicine

## 2024-03-28 ENCOUNTER — Other Ambulatory Visit: Payer: Self-pay

## 2024-03-28 ENCOUNTER — Ambulatory Visit
Admission: EM | Admit: 2024-03-28 | Discharge: 2024-03-28 | Disposition: A | Discharge status: Home / Self Care | Attending: Physician Assistant | Admitting: Physician Assistant

## 2024-03-28 DIAGNOSIS — J069 Acute upper respiratory infection, unspecified: Secondary | ICD-10-CM

## 2024-03-28 DIAGNOSIS — L247 Irritant contact dermatitis due to plants, except food: Secondary | ICD-10-CM

## 2024-03-28 MED ORDER — METHYLPREDNISOLONE 4 MG PO TBPK
ORAL_TABLET | ORAL | 0 refills | Status: AC
Start: 2024-03-28 — End: ?

## 2024-03-28 NOTE — ED Triage Notes (Signed)
 Pt states he got into poison ivy over the weekend then the store throat started last night. Pt c/o dysphagia. Pt states he spit out clear w/blood tinge. Pt denies SOB

## 2024-03-28 NOTE — Discharge Instructions (Addendum)
 Take zyrtec -d as discussed Take steroids as prescribed

## 2024-03-28 NOTE — ED Provider Notes (Signed)
 [facility]    CSN: [id number] Arrival date & time: 03/28/24  4098      History   Chief Complaint No chief complaint on file.   HPI Jordan Kidd is a 38 y.o. male.   Patient here c/w sore throat x last night.  46 year old daughter sick with similar sx.  Reports nasal congestion, rhinorrhea, cough.  Denies f/c, n/v/d, wheezing, SOB.  Not taking anything for this.  Also c/w rash on b/l forearms x 2 days.  Worsening.  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  Admits itchiness.  Denies pain, tenderness, swelling, discharge.    History reviewed. No pertinent past medical history.  There are no active problems to display for this patient.   Past Surgical History:  Procedure Laterality Date   VASECTOMY         Home Medications    Prior to Admission medications   Medication Sig Start Date End Date Taking? Authorizing Provider  methylPREDNISolone (MEDROL DOSEPAK) 4 MG TBPK tablet Follow directions on packaging 03/28/24  Yes Lavonia Powers, PA-C  cetirizine  (ZYRTEC  ALLERGY) 10 MG tablet Take 1 tablet (10 mg total) by mouth daily. 09/04/23   Adolph Hoop, PA-C  meloxicam  (MOBIC ) 15 MG tablet Take 1 tablet (15 mg total) by mouth daily. 10/08/23   Brooks, Dana, DO  methocarbamol (ROBAXIN) 500 MG tablet Take 500 mg by mouth every 8 (eight) hours as needed. 08/28/23   [provider]  pseudoephedrine  (SUDAFED) 60 MG tablet Take 1 tablet (60 mg total) by mouth every 8 (eight) hours as needed for congestion. 09/04/23   Adolph Hoop, PA-C    Family History History reviewed. No pertinent family history.  Social History Social History   Tobacco Use   Smoking status: Never   Smokeless tobacco: Never  Vaping Use   Vaping status: Never Used  Substance Use Topics   Alcohol use: Not Currently   Drug use: Never     Allergies   Sumatriptan   Review of Systems Review of Systems  Constitutional:  Negative for chills, fatigue and fever.  HENT:  Positive for congestion,  rhinorrhea and sore throat. Negative for ear pain, nosebleeds, postnasal drip, sinus pressure and sinus pain.   Eyes:  Negative for pain and redness.  Respiratory:  Positive for cough. Negative for shortness of breath and wheezing.   Gastrointestinal:  Negative for abdominal pain, diarrhea, nausea and vomiting.  Musculoskeletal:  Negative for arthralgias and myalgias.  Skin:  Positive for rash.  Neurological:  Negative for light-headedness and headaches.  Hematological:  Negative for adenopathy. Does not bruise/bleed easily.  Psychiatric/Behavioral:  Negative for confusion and sleep disturbance.      Physical Exam Triage Vital Signs ED Triage Vitals  Encounter Vitals Group     BP 03/28/24 1022 134/81     Systolic BP Percentile --      Diastolic BP Percentile --      Pulse Rate 03/28/24 1022 76     Resp 03/28/24 1022 17     Temp 03/28/24 1022 98.2 F (36.8 C)     Temp Source 03/28/24 1022 Oral     SpO2 03/28/24 1022 98 %     Weight --      Height --      Head Circumference --      Peak Flow --      Pain Score 03/28/24 1019 5     Pain Loc --      Pain Education --  Exclude from Growth Chart --    No data found.  Updated Vital Signs BP 134/81   Pulse 76   Temp 98.2 F (36.8 C) (Oral)   Resp 17   SpO2 98%   Visual Acuity Right Eye Distance:   Left Eye Distance:   Bilateral Distance:    Right Eye Near:   Left Eye Near:    Bilateral Near:     Physical Exam Vitals and nursing note reviewed.  Constitutional:      General: He is not in acute distress.    Appearance: Normal appearance. He is not ill-appearing.  HENT:     Head: Normocephalic and atraumatic.     Right Ear: Tympanic membrane and ear canal normal.     Left Ear: Tympanic membrane and ear canal normal.     Nose: Rhinorrhea present. No mucosal edema or congestion. Rhinorrhea is clear.     Comments: Mildly injected nasal passages    Mouth/Throat:     Pharynx: Oropharynx is clear. Uvula midline. No  pharyngeal swelling, oropharyngeal exudate, posterior oropharyngeal erythema or uvula swelling.     Tonsils: No tonsillar exudate or tonsillar abscesses.  Eyes:     General: No scleral icterus.    Extraocular Movements: Extraocular movements intact.     Conjunctiva/sclera: Conjunctivae normal.  Cardiovascular:     Rate and Rhythm: Normal rate and regular rhythm.     Heart sounds: No murmur heard. Pulmonary:     Effort: Pulmonary effort is normal. No respiratory distress.     Breath sounds: Normal breath sounds. No wheezing or rales.  Musculoskeletal:     Cervical back: Normal range of motion. No rigidity.  Skin:    Coloration: Skin is not jaundiced.     Findings: No rash.     Comments: Several small 1 - 2 mm vesicular lesions b/l forearms, primarily distal volar surfaces, though some small lesions along distal dorsum of forearms as well  Neurological:     General: No focal deficit present.     Mental Status: He is alert and oriented to person, place, and time.     Motor: No weakness.     Gait: Gait normal.  Psychiatric:        Mood and Affect: Mood normal.        Behavior: Behavior normal.      UC Treatments / Results  Labs (all labs ordered are listed, but only abnormal results are displayed) Labs Reviewed - No data to display  EKG   Radiology No results found.  Procedures Procedures (including critical care time)  Medications Ordered in UC Medications - No data to display  Initial Impression / Assessment and Plan / UC Course  I have reviewed the triage vital signs and the nursing notes.  Pertinent labs & imaging results that were available during my care of the patient were reviewed by me and considered in my medical decision making (see chart for details).     Use antihistamine and decongestant medication such as Zyrtec -D or allegra-D Take steroid as prescribed Return PRN Final Clinical Impressions(s) / UC Diagnoses   Final diagnoses:  Viral URI   Irritant contact dermatitis due to plants, except food     Discharge Instructions      Take zyrtec -d as discussed Take steroids as prescribed   ED Prescriptions     Medication Sig Dispense Auth. Provider   methylPREDNISolone (MEDROL DOSEPAK) 4 MG TBPK tablet Follow directions on packaging 1 each Lavonia Powers, PA-C  PDMP not reviewed this encounter.   Lavonia Powers, PA-C 03/28/24 1140

## 2024-09-29 ENCOUNTER — Encounter: Payer: Self-pay | Admitting: Radiology
